# Patient Record
Sex: Female | Born: 1955 | Race: White
Health system: Southern US, Community
[De-identification: ages and names within clinical notes are randomized; demographics above are authoritative.]

---

## 2017-05-15 ENCOUNTER — Other Ambulatory Visit: Payer: Self-pay | Admitting: Hematology & Oncology

## 2017-05-15 DIAGNOSIS — Z95828 Presence of other vascular implants and grafts: Secondary | ICD-10-CM

## 2017-06-22 ENCOUNTER — Ambulatory Visit
Admission: RE | Admit: 2017-06-22 | Discharge: 2017-06-22 | Disposition: A | Discharge status: Home / Self Care | Payer: BC Managed Care – PPO | Source: Ambulatory Visit | Attending: Hematology & Oncology | Admitting: Hematology & Oncology

## 2017-06-22 DIAGNOSIS — Z95828 Presence of other vascular implants and grafts: Secondary | ICD-10-CM

## 2017-06-22 HISTORY — PX: IR RADIOLOGIST EVAL & MGMT: IMG5224

## 2017-06-22 NOTE — Progress Notes (Signed)
Patient ID: Sara Cameron, female   DOB: 03/05/1956, 62 y.o.   MRN: [id number]   Referring Physician(s): Harish,V.C.  Chief Complaint: The patient is seen in follow up today s/p IVC filter placement  History of present illness:  Ms. Sara Cameron presents today s/o IVC filter placement in September of 2018 after developing a LLE DVT with bilateral pulmonary emboli.  She was immediately started an Eliquis, but a filter was requested due to anemia as well as prophylaxis.  She is following with Dr. Allison QuarryHarish and he has requested this filter be removed.  She denies any edema in that leg.  She admits to occasional pain, but not constant and not significant.  She does still admit to shortness of breath specifically with activity, but this is much improved than September.  She presents today for follow up duplex imaging and discussion of removal.   History reviewed. No pertinent past medical history.   Allergies: Patient has no allergy information on record.  Medications: Prior to Admission medications   Not on File     No family history on file.  Social History   Socioeconomic History  . Marital status: Divorced    Spouse name: None  . Number of children: None  . Years of education: None  . Highest education level: None  Social Needs  . Financial resource strain: None  . Food insecurity - worry: None  . Food insecurity - inability: None  . Transportation needs - medical: None  . Transportation needs - non-medical: None  Occupational History  . None  Tobacco Use  . Smoking status: None  Substance and Sexual Activity  . Alcohol use: None  . Drug use: None  . Sexual activity: None  Other Topics Concern  . None  Social History Narrative  . None     Physical Exam Gen: [place], [place], WM female in NAD Heart: regular Lungs: CTAB Abd: soft, NT, ND, +BS Ext: no edema or erythema of her LLE.  Minimal tenderness to palpation of her anterior medial shin.  Imaging: No results  found.  Labs:  CBC: No results for input(s): WBC, HGB, HCT, PLT in the last 8760 hours.  COAGS: No results for input(s): INR, APTT in the last 8760 hours.  BMP: No results for input(s): NA, K, CL, CO2, GLUCOSE, BUN, CALCIUM, CREATININE, GFRNONAA, GFRAA in the last 8760 hours.  Invalid input(s): CMP  LIVER FUNCTION TESTS: No results for input(s): BILITOT, AST, ALT, ALKPHOS, PROT, ALBUMIN in the last 8760 hours.  Assessment:  1. LLE DVT with B PE, s/p IVC filter placement  Will plan to move forward with IVC filter retrieval given the patient's duplex today is unimpressive for significant residual clot.  She is on Eliquis and is closely followed by her hematologist.  She is overall improving and [facility] Dr. Grace IsaacWatts is present.  The procedure was discussed with the patient including risks and complications.  She is agreeable to proceed.  Signed: Letha CapeKelly E Yoceline Bazar, PA-C 06/22/2017, 4:27 PM   Please refer to Dr. Grace IsaacWatts' attestation of this note for management and plan.

## 2019-08-31 IMAGING — US US EXTREM LOW VENOUS BILAT
1 series · 12 of 24 positions shown · non-contrast
Comparison: IVC filter placement - 01/26/2017

CLINICAL DATA: History pulmonary embolism and left lower extremity
DVT and concomitant profound anemia.
TECHNIQUE: Gray-scale sonography with graded compression, as well as color
Doppler and duplex ultrasound were performed to evaluate the lower
extremity deep venous systems from the level of the common femoral
vein and including the common femoral, femoral, profunda femoral,
popliteal and calf veins including the posterior tibial, peroneal
and gastrocnemius veins when visible. The superficial great
saphenous vein was also interrogated. Spectral Doppler was utilized
to evaluate flow at rest and with distal augmentation maneuvers in
the common femoral, femoral and popliteal veins.

[Series 1: us extrem low venous bilat · 0.10mm/px · 12 of 66 slices shown]
[im 3/66]
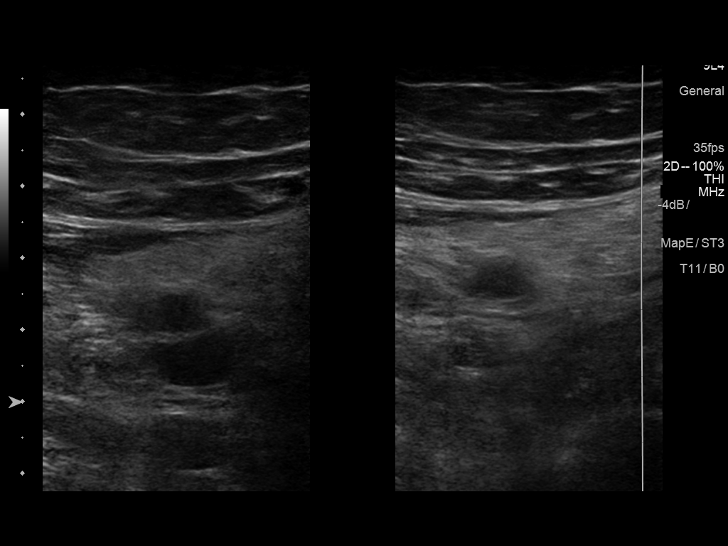
[im 9/66]
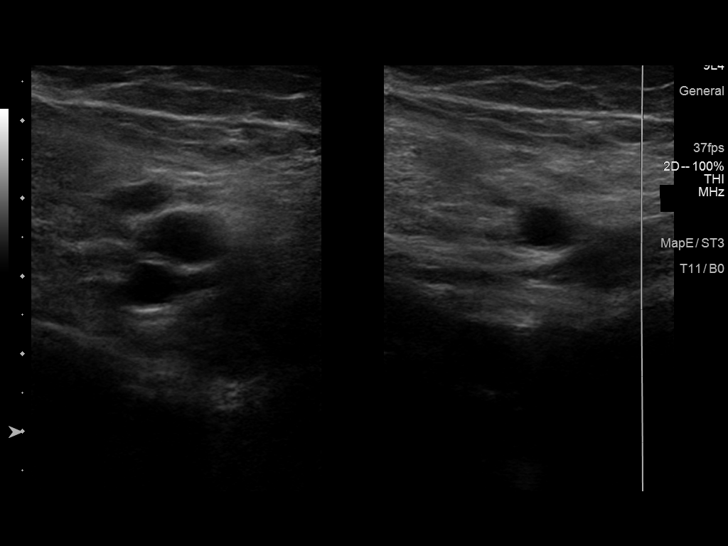
[im 15/66]
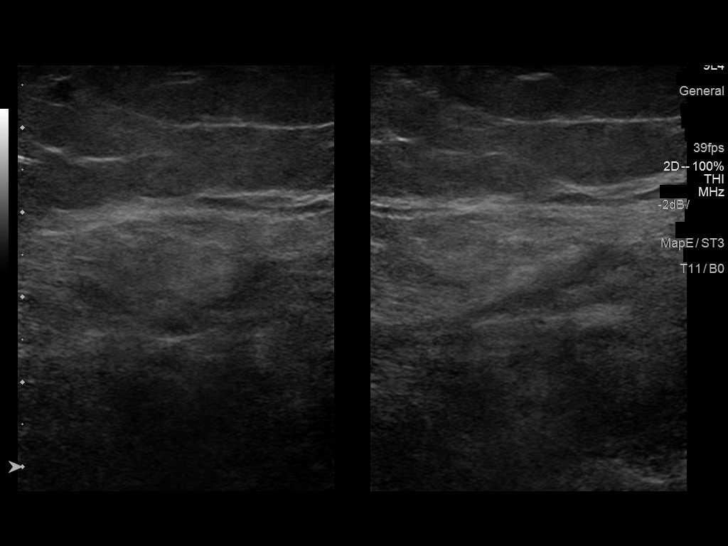
[im 20/66]
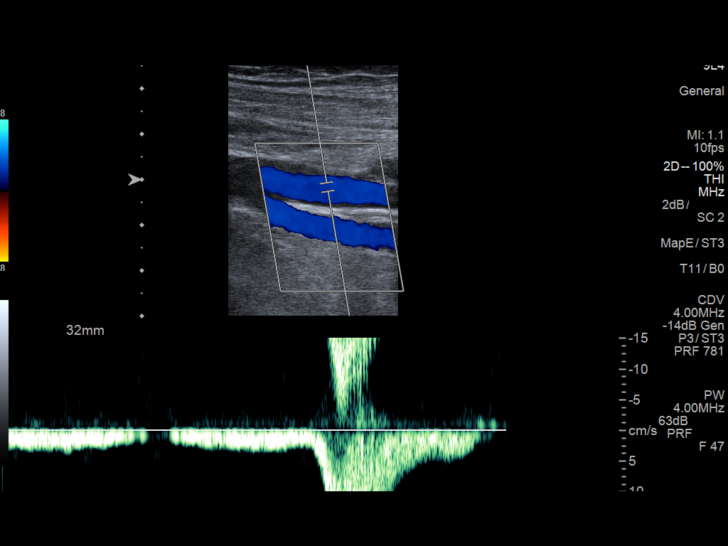
[im 26/66]
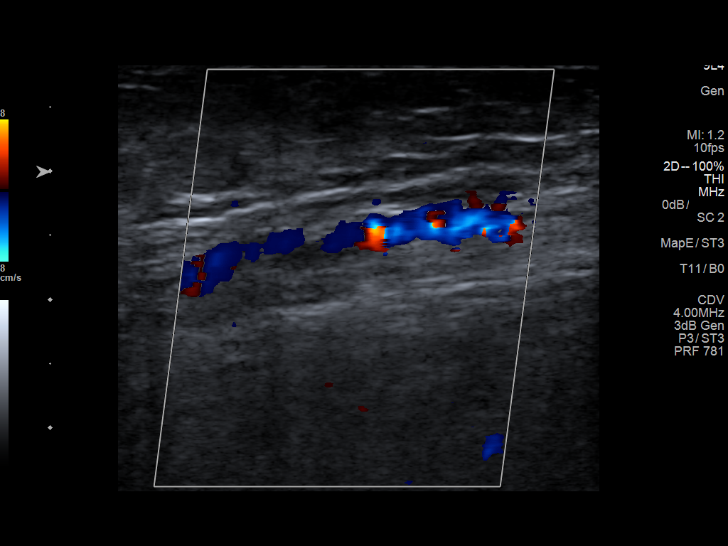
[im 32/66]
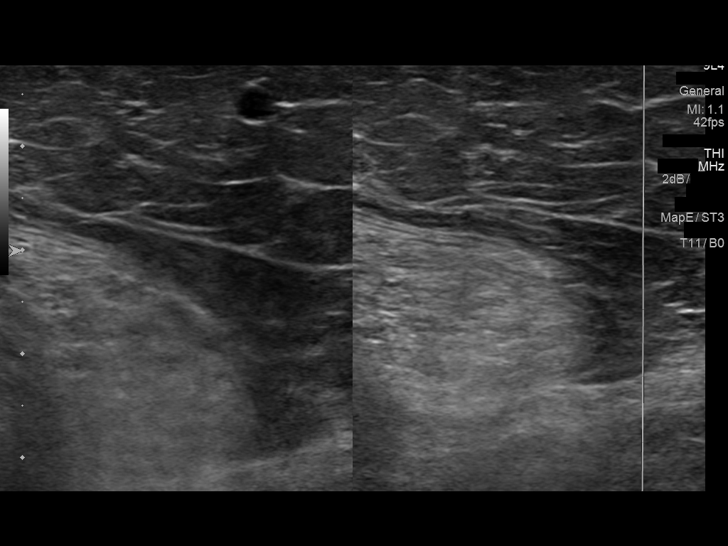
[im 37/66]
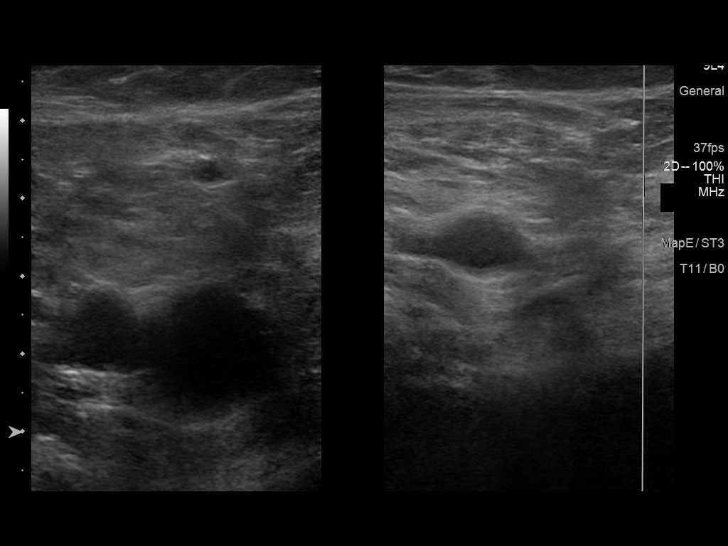
[im 43/66]
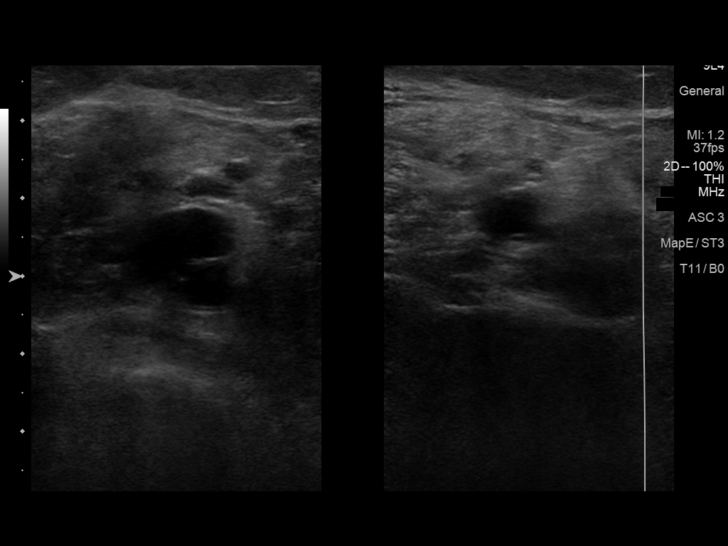
[im 49/66]
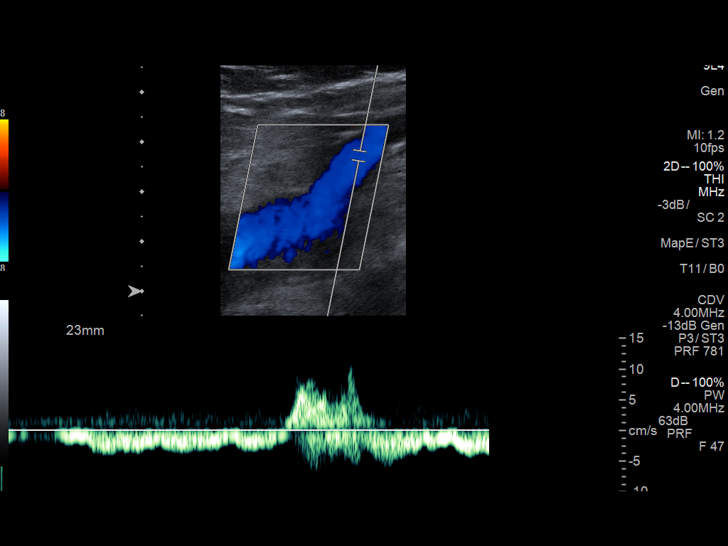
[im 54/66]
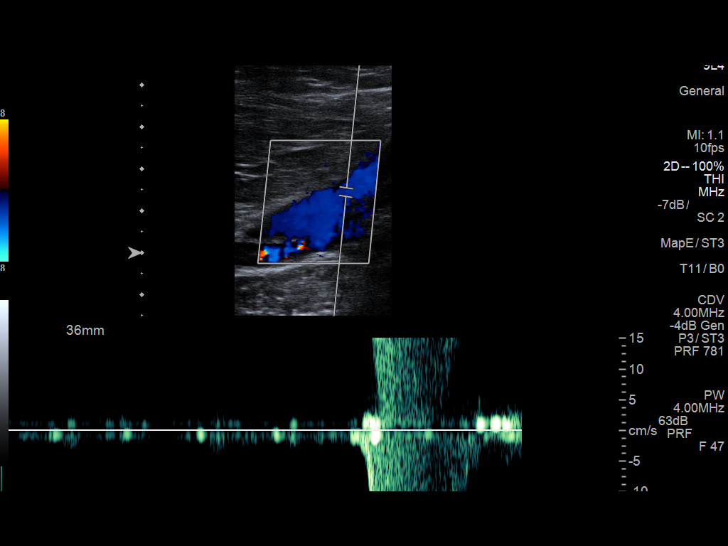
[im 60/66]
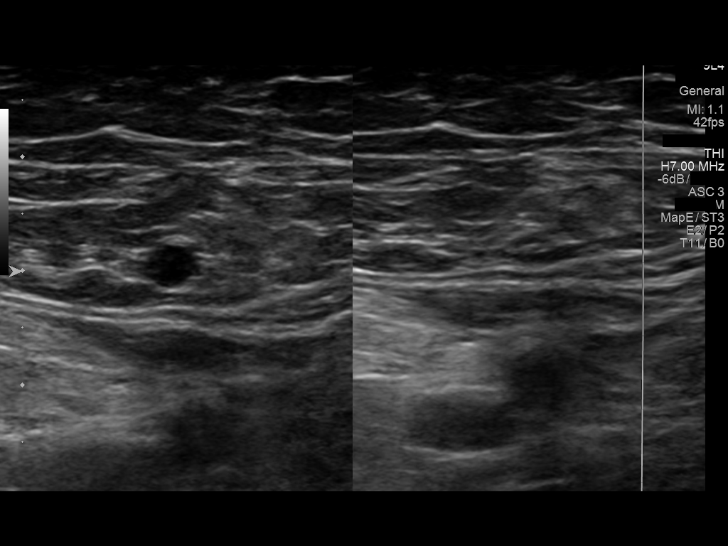
[im 66/66]
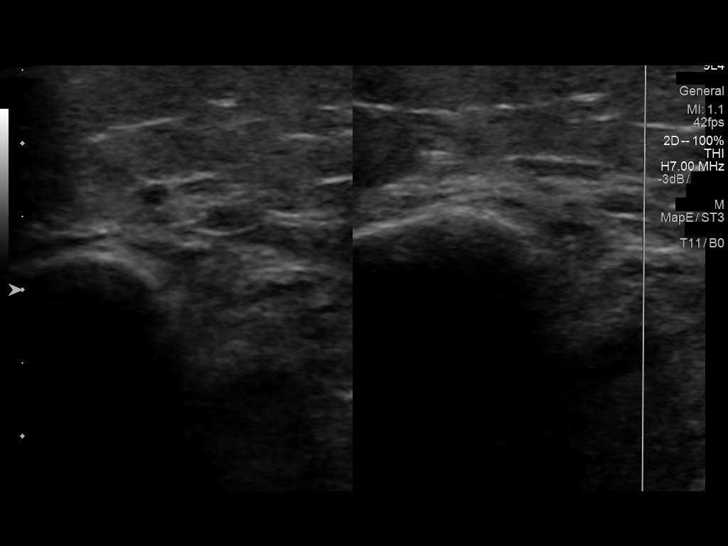

[12 of 24 positions shown; findings below may reference images not displayed]

Given concern for patient's ability to tolerate anticoagulation in
the setting of profound anemia, patient underwent IVC filter
placement for temporary caval interruption purposes on 01/26/2017.

Patient presents today for bilateral lower extremity venous Doppler
ultrasound and evaluation/management for potential IVC filter
retrieval. The patient is currently tolerating systemic
anticoagulation without incident.

EXAM:
BILATERAL LOWER EXTREMITY VENOUS DOPPLER ULTRASOUND
FINDINGS: RIGHT LOWER EXTREMITY

Common Femoral Vein: No evidence of thrombus. Normal
compressibility, respiratory phasicity and response to augmentation.

Saphenofemoral Junction: No evidence of thrombus. Normal
compressibility and flow on color Doppler imaging.

Profunda Femoral Vein: No evidence of thrombus. Normal
compressibility and flow on color Doppler imaging.

Femoral Vein: No evidence of thrombus. Normal compressibility,
respiratory phasicity and response to augmentation.

Popliteal Vein: No evidence of thrombus. Normal compressibility,
respiratory phasicity and response to augmentation.

Calf Veins: No evidence of thrombus. Normal compressibility and flow
on color Doppler imaging.

Superficial Great Saphenous Vein: No evidence of thrombus. Normal
compressibility.

Venous Reflux:  None.

Other Findings:  None.

LEFT LOWER EXTREMITY

Common Femoral Vein: No evidence of thrombus. Normal
compressibility, respiratory phasicity and response to augmentation.

Saphenofemoral Junction: No evidence of thrombus. Normal
compressibility and flow on color Doppler imaging.

Profunda Femoral Vein: No evidence of thrombus. Normal
compressibility and flow on color Doppler imaging.

Femoral Vein: No evidence of thrombus. Normal compressibility,
respiratory phasicity and response to augmentation.

Popliteal Vein: No evidence of acute or chronic thrombus. Normal
compressibility, respiratory phasicity and response to augmentation.

Calf Veins: Suboptimally visualized.

Superficial Great Saphenous Vein: No evidence of thrombus. Normal
compressibility.

Venous Reflux:  None.

Other Findings:  None.
IMPRESSION: No definite evidence of acute or chronic DVT within either lower
extremity.
